# Patient Record
Sex: Male | Born: 1995 | Race: White | Marital: Single
Health system: Northeastern US, Academic
[De-identification: ages and names within clinical notes are randomized; demographics above are authoritative.]

## PROBLEM LIST (undated history)

## (undated) ENCOUNTER — Emergency Department (HOSPITAL_COMMUNITY): Discharge status: Against Medical Advice | Payer: Self-pay

## (undated) DIAGNOSIS — F988 Other specified behavioral and emotional disorders with onset usually occurring in childhood and adolescence: Secondary | ICD-10-CM

---
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
## 2016-05-11 ENCOUNTER — Ambulatory Visit: Admitting: Family Medicine

## 2016-05-11 LAB — HX INFLUENZA A&B BY PCR
CASE NUMBER: 2018087002934
HX INFLUENZA A BY PCR: NOT DETECTED
HX INFLUENZA B BY PCR: NOT DETECTED

## 2016-05-11 NOTE — Addendum Note (Signed)
REPORT:          SITE READ:[id number]    Findings were discussed with the patient's clinician, Josefine Class, by  Darrow Bussing MD on 05/11/2016 3:34 PM.    Darrow Bussing MD 05/11/2016 3:34 PM

## 2016-05-13 ENCOUNTER — Ambulatory Visit: Admitting: Family Medicine

## 2016-05-13 LAB — HX CBC W/ DIFF
CASE NUMBER: 2018089001252
HX HCT: 39.3 % — NL (ref 39.0–50.0)
HX HGB: 12.5 g/dL — ABNORMAL LOW (ref 13.0–17.0)
HX MCH: 28 pg — NL (ref 27.0–31.0)
HX MCHC: 31.8 g/dL — ABNORMAL LOW (ref 32.0–36.0)
HX MCV: 88 fL — NL (ref 80.0–94.0)
HX MPV: 10.1 fL — NL (ref 7.4–11.5)
HX NRBC PERCENT: 0 % — NL
HX PLATELET: 307 10*3/uL — NL (ref 150.0–400.0)
HX RBC: 4.47 10*6/uL — NL (ref 4.2–5.4)
HX RDW: 12.8 % — NL (ref 11.5–14.5)
HX WBC: 7.5 10*3/uL — NL (ref 3.6–10.5)

## 2016-05-13 LAB — HX THROAT CULTURE
CASE NUMBER: 2018087002935
HX F: NORMAL
HX P: NORMAL

## 2016-05-13 LAB — HX GLUCOSE FASTING
CASE NUMBER: 2018089001252
HX GLUCOSE FASTING: 101 mg/dL — NL (ref 65.0–110.0)

## 2016-05-13 LAB — HX URINALYSIS (COMPLETE)
CASE NUMBER: 2018089001253
HX UA BILIRUBIN: NEGATIVE — NL
HX UA BLOOD: NEGATIVE — NL
HX UA GLUCOSE: NEGATIVE — NL
HX UA HYALINE CASTS: 2 /LPF — NL (ref 0.0–4.0)
HX UA KETONES: NEGATIVE — NL
HX UA LEUKOCYTE ESTERASE: NEGATIVE — NL
HX UA NITRITE: NEGATIVE — NL
HX UA PH: 6 — NL (ref 5.0–8.0)
HX UA PROTEIN: NEGATIVE — NL
HX UA RBC: 1 /HPF — NL (ref 0.0–3.0)
HX UA SPECIFIC GRAVITY: 1.019 — NL (ref 1.005–1.03)
HX UA SQUAMOUS EPITHELIAL: <1 — NL (ref 0.0–5.0)
HX UA UROBILINOGEN: 4 — AB
HX UA WBC: 1 /HPF — NL (ref 0.0–5.0)

## 2016-05-13 LAB — HX HEPATIC FUNCTION PANEL
CASE NUMBER: 2018089001252
HX ALBUMIN LVL: 3.5 g/dL — NL (ref 3.5–5.0)
HX ALKALINE PHOSPHATASE: 72 U/L — NL (ref 45.0–117.0)
HX ALT: 52 U/L — NL (ref 6.0–78.0)
HX AST: 29 U/L — NL (ref 6.0–40.0)
HX BILIRUBIN DIRECT: 0.2 mg/dL — NL (ref 0.0–0.3)
HX BILIRUBIN TOTAL: 0.8 mg/dL — NL (ref 0.2–1.2)
HX TOTAL PROTEIN: 8.3 g/dL — ABNORMAL HIGH (ref 6.0–8.0)

## 2016-05-13 LAB — HX ELECTROLYTE PANEL
CASE NUMBER: 2018089001252
HX ANION GAP: 7 — NL (ref 3.0–11.0)
HX CHLORIDE: 107 mmol/L — NL (ref 98.0–110.0)
HX CO2: 26 mmol/L — NL (ref 21.0–32.0)
HX POTASSIUM LVL: 4.1 mmol/L — NL (ref 3.6–5.2)
HX SODIUM LVL: 140 mmol/L — NL (ref 136.0–145.0)

## 2016-05-13 LAB — HX BUN
CASE NUMBER: 2018089001252
HX BUN: 9 mg/dL — NL (ref 7.0–18.0)

## 2016-05-13 LAB — HX LIPID PANEL
CASE NUMBER: 2018089001252
HX CHOL: 120 mg/dL — ABNORMAL LOW (ref 140.0–200.0)
HX HDL: 27 mg/dL — ABNORMAL LOW (ref 41.0–60.0)
HX LDL: 70 mg/dL — NL (ref 0.0–129.0)
HX TRIG: 116 mg/dL — NL (ref 0.0–149.0)

## 2016-05-13 LAB — HX .AUTOMATED DIFF
CASE NUMBER: 2018089001252
HX ABSOLUTE NEUTRO COUNT: 4590 /mm3
HX BASOPHILS: 1 % — NL (ref 0.0–1.0)
HX EOSINOPHILS: 8 % — ABNORMAL HIGH (ref 0.0–3.0)
HX IMMATURE GRANULOCYTES: 0 % — NL (ref 0.0–2.0)
HX LYMPHOCYTES: 20 % — ABNORMAL LOW (ref 22.0–40.0)
HX MONOCYTES: 9 % — NL (ref 0.0–11.0)
HX NEU CT MEASURED: 4.59
HX NEUTROPHILS: 62 % — NL (ref 40.0–71.0)

## 2016-05-13 LAB — HX GLOMERULAR FILTRATION RATE (ESTIMATED)
CASE NUMBER: 2018089001252
HX AFN AMER GLOMERULAR FILTRATION RATE: >90
HX NON-AFN AMER GLOMERULAR FILTRATION RATE: >90

## 2016-05-13 LAB — HX TSH REFLEX PANEL (RECOMMENDED)
CASE NUMBER: 2018089001252
HX 3RD GEN TSH: 2.36 u[IU]/mL — NL (ref 0.463–3.98)

## 2016-05-13 LAB — HX CREATININE LEVEL
CASE NUMBER: 2018089001252
HX CREATININE: 0.637 mg/dL — NL (ref 0.55–1.3)

## 2016-05-16 LAB — HX THROAT CULTURE
CASE NUMBER: 2018089001254
HX F: NORMAL
HX P: NORMAL

## 2016-05-21 ENCOUNTER — Ambulatory Visit: Admitting: Family Medicine

## 2016-05-24 LAB — HX HEMOGLOBIN EVALUATION
CASE NUMBER: 2018097000998
HX HGB A1: 96.5 %
HX HGB A2: 2.8 %
HX HGB C: 0 %
HX HGB E: 0 %
HX HGB F: 0.7 %
HX HGB OTH: 0 %
HX HGB S: 0 %

## 2017-04-06 ENCOUNTER — Ambulatory Visit: Admitting: Specialist

## 2017-04-06 NOTE — Progress Notes (Addendum)
* * *        **[name]    --- ---    32 Y old Male, DOB: 1995/05/12    Account Number: [id number]    [address], [place], [id number]    Home: [phone]    Guarantor: Lexine Baton Insurance: [employer] - [employer]    External Visit ID: [id number]    Appointment Facility: [facility]        * * *    04/06/2017  Progress Notes: Reinaldo Berber, M.D.    --- ---    ---         **Current Medications**    ---       None    ---      Past Medical History    ---       Previous PCP: Dr. Terrilee Files [facility].        ---    Pre-Diabetic .        ---    Pneumonia 05/21/2016 - cxr.        ---       **Surgical History**    ---       Denies Past Surgical History    ---       **Family History**    ---       Father: alive    ---    Mother: alive    ---    Siblings: alive    ---    Brother: alive    ---    Children: alive    ---    Daughter(s): alive    ---    1 brother(s) - healthy. 1daughter(s) - healthy.    ---      **Social History**    ---    Diet: healthy.    Marijuana: yes.    Seat belts: yes.    no Sun Screen.    Guns in home: none.    Feels safe in home: yes.    no Drugs, cocaine use in the past .    Alcohol: yes .    Children: yes, daughter.    Education: yes, HS.    Exercise: yes.    Living with: friend .    Marital Status: single.    Occupation: employed, Development worker, community ( driver) [employer] .    Pets: dog.    Sexually active: yes.    Tobacco Use Status: current smoker smokes when stressed, 2 cigg per week ,  Patient screened/cessation counseled on: 04/06/2017, Patient counseled on  cessation: 04/06/2017.    no Travel outside [place].      **Allergies**    ---       N.K.D.A.    ---       **Hospitalization/Major Diagnostic Procedure**    ---       Denies Past Hospitalization    ---       **Review of Systems**    ---     _GENERAL ROS_ :    no chest pain. no shortness of breath. no wheezing. no nausea. no vomiting. no  diarrhea. no  constipation. no abdominal pain. no black or bloody stools. no  change in bowel or bladder function/habits. no dyspepsia. no dysphagia. no  dysuria. no hematuria. no nocturia. no fever. no chills. no rash. no night  sweats. no headaches. no dizziness. no lightheadedness. no change in moles. no  bone pain. no joint/muscle pain. no  weight loss/gain. appetite good. no edema.  no Back Pain. no Change in Vision. no Cold/heat intolerance. no Increase in  thirst/appetite. Anxiety/Stress yes. no Depression. Sleep Problems yes. no  Unexplain lumps. no Easy Brusing /Bleeding. no Any GU Symptoms. no fatigue. no  heartburn. no hot flashes and night sweats. no history of abnormal bleeding.  no hair loss. no palpitations. no Abnormal mood changes.            **Reason for Appointment**    ---       1\. New paitent physical, he presents to our office for new pt physical. He  has no cp or pressures, no nvd, no falls, h/o anxiety, smoking cessation is  adivsed.    ---       **History of Present Illness**    ---     _Depression Screening_ :    PHQ-2 (2015 Edition) Little interest or pleasure in doing things? Not at all,  Feeling down, depressed, or hopeless? Not at all, Total Score 0\.    _food choices_ :    c/o Calorie intake healthy diet is advised..    _Smoking Cessation_ :    Denies : Smoking cessation. Denies : Cough. Denies : Shortness of breath.      **Vital Signs**    ---    BP 130/70, HR 94, Ht 67.25, Wt 356, BMI 55.34, RR 18, Oxygen sat % 97.       **Examination**    ---     _General Examination_ :    GENERAL APPEARANCE: Well developed and well nourished, alert and oriented, no  acute distress, pleasant, cooperative . HEAD: Normocephalic, atraumatic, no  tenderness to palpation. HEENT: external ears normal, hearing grossly normal,  EAC's normal, tympanic membranes normal, no rhinorrhea, turbinates normal,  oropharynx clear with MMM. EYES EOMI, PERRL, anicteric, conjunctiva clear,  fundi benign, visual acuity grossly normal.  OROPHARYNX: moist mucous  membranes,no lesions, dentition in good repair. NECK: supple, no  lymphadenopathy, no thyromegaly, no carotid bruits, normal ROM, non-tender.  LYMPH NODES: normal, no axillary, cervical,supraclavicular or inguinal nodes.  HEART: regular rate and rhythm, normal S1 & S2, no murmurs, rubs, gallops or  clicks. LUNGS: clear to auscultation bilaterally, no rhonchi, rales or  wheezes. ABDOMEN: benign, soft, non-tender, no organomegaly, bowel sounds are  normal, no guarding, no hepatosplenomegaly, no masses palpated, no rebound, no  rigidity, no suprapubic tenderness, Abdominal bruit: no. BACK: unremarkable,  normal, normal range of motion of spine, no paraspinal tenderness, normal  alignment, no CVA tenderness, no tenderness. EXTREMITIES: no edema/calves non-  tender/no cord, pulses 2 plus bilaterally. NEUROLOGIC EXAM: grossly non-focal  exam, CN's II-XII grossly intact, DTR's 2+ & symmetric all 4 extremities, gait  normal, normal sensation and strength. SKIN: no rash, no atypical nevi.  MUSCULOSKELETAL: no weakness, no deformities. MALE GENITOURINARY: normal  external male genitalia,no penile lesions or discharge,testicles - nontender  and no palpable masses,no hernias, adult phallus and hair growth..  PSYCHOLOGICAL: affect normal. JOINTS: within normal limits - no gross  deformity. RECTAL: externally normal.          **Assessments**    ---    1\. Adult general medical exam - Z00.00 (Primary)    ---    2\. 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 Pre-diabetes - R73.03    ---    4\. Vitamin D deficiency - E55.9    ---    5\. Smoker - F17.200    ---  6\. Anxiety - F41.9    ---    7\. Mood disorder - F39    ---    8\. Sleep disorder - G47.9    ---       **Treatment**    ---       **1\. Adult general medical exam**    _LAB: CBC w/ Indices_   WBC  9.8    4.0-11.0 - thous/mm3    --- --- --- ---    RBC  4.60    4.20-5.90 - Mil/mm3    --- --- --- ---    Hgb  12.6  L   13.0-17.5 - Gm/dL    --- --- --- ---    Hct  40.5    39.0-53.0 - %    --- --- --- ---    MCV  88.0    80.0-100.0 - fL    --- --- --- ---    MCH  27.4    26.0-34.0 - pGm    --- --- --- ---    MCHC  31.1    31.0-37.0 - Gm/dL    --- --- --- ---    Platelet  302    150-400 - thous/mm3    --- --- --- ---    RDW-SD  41.8    35.0-51.0 - fL    --- --- --- ---    MPV  10.6    9.4-12.4 - fL    --- --- --- ---    _LAB: Comprehensive Metabolic Panel_ Creatinine  0.587    0.550-1.300 - mg/dL    --- --- --- ---    Sodium Lvl  140    136-146 - mmol/L    --- --- --- ---    Potassium Lvl  4.2    3.6-5.2 - mmol/L    --- --- --- ---    Chloride  107    98-110 - mmol/L    --- --- --- ---    CO2  25    21-32 - mmol/L    --- --- --- ---    Total Protein  7.5    6.0-8.4 - Gm/dL    --- --- --- ---    Albumin Lvl  3.8    3.2-5.0 - Gm/dL    --- --- --- ---    Calcium Lvl  8.8    8.5-10.5 - mg/dL    --- --- --- ---    Glucose Lvl  95    70-110 - mg/dL    --- --- --- ---    Bili Total  0.4    0.2-1.2 - mg/dL    --- --- --- ---    Alk Phos  80    30-117 - Units/L    --- --- --- ---    AST  19    6-40 - Units/L    --- --- --- ---    ALT  43    6-55 - Units/L    --- --- --- ---    Anion Gap  8    3-11 -    --- --- --- ---    BUN  11    6-20 - mg/dL    --- --- --- ---    _LAB: Hemoglobin A1c_ Mean Bld Glucos  108     \- mg/dL    --- --- --- ---    Glycated Hgb  5.4    <=5.6 - %    --- --- --- ---    _LAB: Lipid Panel_ Chol  122    <=200 - mg/dL    --- --- --- ---    HDL  36  L  >=40 - mg/dL    --- --- --- ---    Trig  91    <=150 - mg/dL    --- --- --- ---    LDL  68    <=130 - mg/dL    --- --- --- ---    _LAB: Microalbumin Level Urine_ Ur Microalb Random  10.0    <=20.0 - mg/L    --- --- --- ---    Ur Creat Microalb  171.0    15.0-392.0 - mg/dL    --- --- --- ---    Microalb/Creat  5.8    0.0-20.0 - mg/Gm_crea    --- --- --- ---    _LAB: TSH Reflex Panel (Recommended)_ 3rd Gen TSH  2.620    0.358-3.740 -  mclU/ml    --- --- --- ---    _LAB:  Urinalysis (Complete)_ UA Clarity  Clear    Clear -    --- --- --- ---    UA Spec Grav  1.019    1.003-1.030 -    --- --- --- ---    UA pH  5.0    5.0-8.0 -    --- --- --- ---    UA Protein  Negative    Negative - mg/dL    --- --- --- ---    UA Glucose  Negative    Negative - mg/dL    --- --- --- ---    UA Ketones  Negative    Negative - mg/dL    --- --- --- ---    UA Blood  Small  A  Negative -    --- --- --- ---    UA Bili  Negative    Negative -    --- --- --- ---    UA Urobilinogen  Negative    Negative -    --- --- --- ---    UA Nitrite  Negative    Negative -    --- --- --- ---    UA Leuk Est  Negative    Negative - WBC/uL    --- --- --- ---    UA RBC  1    0-2 - /HPF    --- --- --- ---    UA WBC  1    0-5 - /HPF    --- --- --- ---    UA Bacteria  None    None -    --- --- --- ---    UA Squamous Epi  <1    0-5 - /HPF    --- --- --- ---    UA Mucous  Few    None -    --- --- --- ---    UA Color  Yellow    Yellow -    --- --- --- ---    _LAB: Vitamin D 25 Hydroxy Level_ Vitamin D 25 OH Lvl  7  L  30-100 - nGm/ml    --- --- --- ---        Notes: Healthy diet and excercise is advised, and goals are advised.        **2\. Obesity, Class III, BMI 40-49.9 (morbid obesity)**    Notes: wt loss is advised.        **3\. Pre-diabetes**    _LAB: CBC w/ Indices_   WBC  9.8    4.0-11.0 - thous/mm3    --- --- --- ---  RBC  4.60    4.20-5.90 - Mil/mm3    --- --- --- ---    Hgb  12.6  L  13.0-17.5 - Gm/dL    --- --- --- ---    Hct  40.5    39.0-53.0 - %    --- --- --- ---    MCV  88.0    80.0-100.0 - fL    --- --- --- ---    MCH  27.4    26.0-34.0 - pGm    --- --- --- ---    MCHC  31.1    31.0-37.0 - Gm/dL    --- --- --- ---    Platelet  302    150-400 - thous/mm3    --- --- --- ---    RDW-SD  41.8    35.0-51.0 - fL    --- --- --- ---    MPV  10.6    9.4-12.4 - fL    --- --- --- ---    _LAB: Comprehensive Metabolic Panel_ Creatinine  0.587    0.550-1.300 - mg/dL    --- --- --- ---    Sodium Lvl  140    136-146 - mmol/L     --- --- --- ---    Potassium Lvl  4.2    3.6-5.2 - mmol/L    --- --- --- ---    Chloride  107    98-110 - mmol/L    --- --- --- ---    CO2  25    21-32 - mmol/L    --- --- --- ---    Total Protein  7.5    6.0-8.4 - Gm/dL    --- --- --- ---    Albumin Lvl  3.8    3.2-5.0 - Gm/dL    --- --- --- ---    Calcium Lvl  8.8    8.5-10.5 - mg/dL    --- --- --- ---    Glucose Lvl  95    70-110 - mg/dL    --- --- --- ---    Bili Total  0.4    0.2-1.2 - mg/dL    --- --- --- ---    Alk Phos  80    30-117 - Units/L    --- --- --- ---    AST  19    6-40 - Units/L    --- --- --- ---    ALT  43    6-55 - Units/L    --- --- --- ---    Anion Gap  8    3-11 -    --- --- --- ---    BUN  11    6-20 - mg/dL    --- --- --- ---    _LAB: Hemoglobin A1c_ Mean Bld Glucos  108     \- mg/dL    --- --- --- ---    Glycated Hgb  5.4    <=5.6 - %    --- --- --- ---    _LAB: Lipid Panel_ Chol  122    <=200 - mg/dL    --- --- --- ---    HDL  36  L  >=40 - mg/dL    --- --- --- ---    Trig  91    <=150 - mg/dL    --- --- --- ---    LDL  68    <=130 - mg/dL    --- --- --- ---    _LAB: Microalbumin Level Urine_ Ur Microalb Random  10.0    <=20.0 - mg/L    --- --- --- ---    Ur Creat Microalb  171.0    15.0-392.0 - mg/dL    --- --- --- ---    Microalb/Creat  5.8    0.0-20.0 - mg/Gm_crea    --- --- --- ---    _LAB: TSH Reflex Panel (Recommended)_ 3rd Gen TSH  2.620    0.358-3.740 -  mclU/ml    --- --- --- ---    _LAB: Urinalysis (Complete)_ UA Clarity  Clear    Clear -    --- --- --- ---    UA Spec Grav  1.019    1.003-1.030 -    --- --- --- ---    UA pH  5.0    5.0-8.0 -    --- --- --- ---    UA Protein  Negative    Negative - mg/dL    --- --- --- ---    UA Glucose  Negative    Negative - mg/dL    --- --- --- ---    UA Ketones  Negative    Negative - mg/dL    --- --- --- ---    UA Blood  Small  A  Negative -    --- --- --- ---    UA Bili  Negative    Negative -    --- --- --- ---    UA Urobilinogen  Negative    Negative -    --- --- --- ---    UA Nitrite   Negative    Negative -    --- --- --- ---    UA Leuk Est  Negative    Negative - WBC/uL    --- --- --- ---    UA RBC  1    0-2 - /HPF    --- --- --- ---    UA WBC  1    0-5 - /HPF    --- --- --- ---    UA Bacteria  None    None -    --- --- --- ---    UA Squamous Epi  <1    0-5 - /HPF    --- --- --- ---    UA Mucous  Few    None -    --- --- --- ---    UA Color  Yellow    Yellow -    --- --- --- ---    _LAB: Vitamin D 25 Hydroxy Level_ Vitamin D 25 OH Lvl  7  L  30-100 - nGm/ml    --- --- --- ---        Notes: Healthy diet and excercise and goals are advised.        **4\. Vitamin D deficiency**    Notes: vit d as needed.        **5\. Smoker**    Notes: cessation is advised.        **6\. Anxiety**    Notes: counseling is advised, no si or hi.        **7\. Mood disorder**    Notes: stable no si or hi.        **8\. Sleep disorder**    Notes:    sleep study as needed.    .        **9\. Others**    Notes: Patient Educated with: Physical activity.pdf (Physical activity.pdf).      **Preventive Medicine**    ---       [facility]  requested. he declines sleep study. dental care is advised,  Immnizations as needed.        ---       **Visit Codes**    ---       [id number] PREV MED VISIT NEW PT 18-39 YR.    ---      **Follow Up**    ---    prn, 3 Months (Reason: labs and testing)    Electronically signed by Reinaldo Berber , MD on 04/17/2017 at 09:02 PM EST    Sign off status: Completed        * * [facility]    [address]    [place], [place] [address]    Tel: [phone]    Fax: [phone]              * * *          Patient: NEVADA, MULLETT DOB: 02-19-1995 Progress Note: Reinaldo Berber,  M.D. 04/06/2017    ---    Note generated by eClinicalWorks EMR/PM Software ([email or web address])

## 2017-04-06 NOTE — Progress Notes (Addendum)
* * *        **  [name]    --- ---    78 Y old Male, DOB: Feb 22, 1995    [address] [place], [place] [address]    Home: [phone]    Provider: Reinaldo Berber        * * *    Telephone Encounter    ---    Answered by   Reinaldo Berber  Date: 04/06/2017         Time: 10:37 AM    Caller   Dr. Viviann Spare    --- ---            Reason   [facility] referral for counseling.            Action Taken                      Scuderi [place],Angela  04/06/2017 11:45:15 AM > referral to [facility] due to long wait list at [facility]. In your bin to be signed and faxed                    * * *                ---          * * *          Patient: Kyle Powers, Kyle Powers DOB: 17-Dec-1995 Provider: Reinaldo Berber  04/06/2017    ---    Note generated by eClinicalWorks EMR/PM Software ([email or web address])

## 2017-04-06 NOTE — Progress Notes (Addendum)
* * *        **  [name]    --- ---    47 Y old Male, DOB: [birth year]    [address], [place], [place] [address]    Home: [phone]    Provider: Reinaldo Berber        * * *    Telephone Encounter    ---    Answered by   Reinaldo Berber  Date: 04/06/2017         Time: 10:36 AM    Caller   Dr. Viviann Spare    --- ---            Reason   Nutrition/ dietician referral            Message                      for healthy diet and excercise and weight loss                Action Taken                      Scuderi [place],Angela  04/06/2017 11:26:20 AM > referral in your bin to be signed and faxed                    * * *                ---          * * *          Patient: Kyle Powers, Kyle Powers DOB: 05-Mar-1995 Provider: Reinaldo Berber  04/06/2017    ---    Note generated by eClinicalWorks EMR/PM Software ([email or web address])

## 2017-04-06 NOTE — Progress Notes (Addendum)
* * *        **  [name]    --- ---    76 Y old Male, DOB: 1995/12/30    [address] [place], [place] [address]    Home: [phone]    Provider: Reinaldo Berber        * * *    Telephone Encounter    ---    Answered by   Betsy Coder  Date: 04/06/2017         Time: 10:07 AM        * * *                ---          * * *          Patient: Kyle Powers, Kyle Powers DOB: [birth year] Provider: Reinaldo Berber  04/06/2017    ---    Note generated by eClinicalWorks EMR/PM Software ([email or web address])

## 2017-04-06 NOTE — Progress Notes (Addendum)
* * *        **  [name]    --- ---    41 Y old Male, DOB: 1996-02-09    [address], [place], [place] [address]    Home: [phone]    Provider: Reinaldo Berber        * * *    Telephone Encounter    ---    Answered by   Reinaldo Berber  Date: 04/06/2017         Time: 10:40 AM    Caller   Dr. Viviann Spare    --- ---            Reason   OMR pneumonia 2018            Message                      please obtain cxr report from [facility]                Action Taken                      Scuderi [place],Angela  04/06/2017 10:46:09 AM > report given to Dr. Viviann Spare at time of pts visit.                     * * *                ---          * * *          Patient: Kyle Powers, Kyle Powers DOB: 1996/01/14 Provider: Reinaldo Berber  04/06/2017    ---    Note generated by eClinicalWorks EMR/PM Software ([email or web address])

## 2017-04-11 ENCOUNTER — Ambulatory Visit: Admitting: Specialist

## 2017-04-11 LAB — HX MICROALBUMIN LEVEL URINE
CASE NUMBER: 2019057001251
HX MICROALBUMIN/CREAT RATIO: 5.8 mg_alb/Gm_crea — NL (ref 0.0–20.0)
HX UR CREAT (MICROALBUMIN): 171 mg/dL — NL (ref 15.0–392.0)
HX UR MICROALBUMIN RANDOM: 10 mg/L — NL

## 2017-04-11 LAB — HX COMPREHENSIVE METABOLIC PANEL
CASE NUMBER: 2019057001250
HX ALBUMIN LVL: 3.8 g/dL — NL (ref 3.2–5.0)
HX ALKALINE PHOSPHATASE: 80 U/L — NL (ref 30.0–117.0)
HX ALT: 43 U/L — NL (ref 6.0–55.0)
HX ANION GAP: 8 — NL (ref 3.0–11.0)
HX AST: 19 U/L — NL (ref 6.0–40.0)
HX BILIRUBIN TOTAL: 0.4 mg/dL — NL (ref 0.2–1.2)
HX BUN: 11 mg/dL — NL (ref 6.0–20.0)
HX CALCIUM LVL: 8.8 mg/dL — NL (ref 8.5–10.5)
HX CHLORIDE: 107 mmol/L — NL (ref 98.0–110.0)
HX CO2: 25 mmol/L — NL (ref 21.0–32.0)
HX CREATININE: 0.587 mg/dL — NL (ref 0.55–1.3)
HX GLUCOSE LVL: 95 mg/dL — NL (ref 70.0–110.0)
HX POTASSIUM LVL: 4.2 mmol/L — NL (ref 3.6–5.2)
HX SODIUM LVL: 140 mmol/L — NL (ref 136.0–146.0)
HX TOTAL PROTEIN: 7.5 g/dL — NL (ref 6.0–8.4)

## 2017-04-11 LAB — HX LIPID PANEL
CASE NUMBER: 2019057001250
HX CHOL: 122 mg/dL — NL
HX HDL: 36 mg/dL — ABNORMAL LOW
HX LDL: 68 mg/dL — NL
HX TRIG: 91 mg/dL — NL

## 2017-04-11 LAB — HX CBC W/ INDICES
CASE NUMBER: 2019057001250
HX ABSOLUTE NRBC COUNT: 0 10*3/uL
HX HCT: 40.5 % — NL (ref 39.0–53.0)
HX HGB: 12.6 g/dL — ABNORMAL LOW (ref 13.0–17.5)
HX MCH: 27.4 pg — NL (ref 26.0–34.0)
HX MCHC: 31.1 g/dL — NL (ref 31.0–37.0)
HX MCV: 88 fL — NL (ref 80.0–100.0)
HX MPV: 10.6 fL — NL (ref 9.4–12.4)
HX NRBC PERCENT: 0 % — NL
HX PLATELET: 302 10*3/uL — NL (ref 150.0–400.0)
HX RBC: 4.6 10*6/uL — NL (ref 4.2–5.9)
HX RDW-CV: 13.1 % — NL (ref 11.5–14.5)
HX RDW-SD: 41.8 fL — NL (ref 35.0–51.0)
HX WBC: 9.8 10*3/uL — NL (ref 4.0–11.0)

## 2017-04-11 LAB — HX URINALYSIS (COMPLETE)
CASE NUMBER: 2019057001251
HX UA BILIRUBIN: NEGATIVE — NL
HX UA GLUCOSE: NEGATIVE — NL
HX UA KETONES: NEGATIVE — NL
HX UA LEUKOCYTE ESTERASE: NEGATIVE — NL
HX UA NITRITE: NEGATIVE — NL
HX UA PH: 5 — NL (ref 5.0–8.0)
HX UA PROTEIN: NEGATIVE — NL
HX UA RBC: 1 /HPF — NL (ref 0.0–2.0)
HX UA SPECIFIC GRAVITY: 1.019 — NL (ref 1.003–1.03)
HX UA SQUAMOUS EPITHELIAL: <1 — NL (ref 0.0–5.0)
HX UA UROBILINOGEN: NEGATIVE — NL
HX UA WBC: 1 /HPF — NL (ref 0.0–5.0)

## 2017-04-11 LAB — HX GLOMERULAR FILTRATION RATE (ESTIMATED)
CASE NUMBER: 2019057001250
HX AFN AMER GLOMERULAR FILTRATION RATE: >90
HX NON-AFN AMER GLOMERULAR FILTRATION RATE: >90

## 2017-04-11 LAB — HX HEMOGLOBIN A1C
CASE NUMBER: 2019057001250
HX EST AVERAGE GLUCOSE (EAG): 108 mg/dL
HX HBF (INTERNAL): 1.1 % — NL
HX HEMOGLOBIN A1C: 5.4 % — NL
HX LA1C (INTERNAL): 1.7 % — NL
HX P3 PEAK (INTERNAL): 3.2 % — NL
HX P4 PEAK (INTERNAL): 1.2 % — NL
HX TOTAL AREA RANGE (INTERNAL): 1.63 microvolt/sec — NL (ref 1.0–3.5)

## 2017-04-11 LAB — HX TSH REFLEX PANEL (RECOMMENDED)
CASE NUMBER: 2019057001250
HX 3RD GEN TSH: 2.62 u[IU]/mL — NL (ref 0.358–3.74)

## 2017-04-11 LAB — HX VITAMIN D 25 HYDROXY LEVEL (RECOMMENDED)
CASE NUMBER: 2019057001250
HX VITAMIN D 25 OH LVL: 7 ng/mL — ABNORMAL LOW (ref 30.0–100.0)

## 2017-04-16 ENCOUNTER — Ambulatory Visit: Admitting: Specialist

## 2017-04-16 NOTE — Progress Notes (Addendum)
* * *        **  Kyle Powers    --- ---    13 Y old Male, DOB: Jun 12, 1995    [address], [place], [place] [address]    Home: [phone]    Provider: Reinaldo Berber        * * *    Telephone Encounter    ---    Answered by   Reinaldo Berber  Date: 04/16/2017         Time: 09:32 PM    Caller   Dr. Jacolyn Reedy    --- ---            Reason   Labs are noted - abnls            Message                      small blook on ua, vit d low, 7, hdl low, and hct slightly reduced.            keep healthy diet, and add vitamin d3 at 4,000 units daily, and healthy diet and excercise and wt loss is advised, check on the nutrition referral, keep follow up apt                Action Taken                      Scuderi [place],Angela  04/17/2017 9:51:05 AM > spoke with the pt he is aware.                     * * *                ---          * * *          Patient: Kyle Powers, Kyle Powers DOB: Feb 02, 1996 Provider: Reinaldo Berber  04/16/2017    ---    Note generated by eClinicalWorks EMR/PM Software ([email or web address])

## 2017-04-17 ENCOUNTER — Ambulatory Visit: Admitting: Specialist

## 2017-04-17 NOTE — Progress Notes (Addendum)
* * *        **  [name]    --- ---    13 Y old Male, DOB: [birth year]    [address], [place], [place] [address]    Home: [phone]    Provider: Reinaldo Berber        * * *    Telephone Encounter    ---    Answered by   Reinaldo Berber  Date: 04/17/2017         Time: 06:02 PM    Caller   Dr. Viviann Spare    --- ---            Reason   OMR received from Dr. Massie Maroon            Message                      noted                    * * *                ---          * * *          Patient: Kyle Powers, Kyle Powers DOB: Aug 09, 1995 Provider: Reinaldo Berber  04/17/2017    ---    Note generated by eClinicalWorks EMR/PM Software ([email or web address])

## 2017-07-06 ENCOUNTER — Ambulatory Visit: Admitting: Specialist

## 2017-07-06 ENCOUNTER — Ambulatory Visit (HOSPITAL_BASED_OUTPATIENT_CLINIC_OR_DEPARTMENT_OTHER)

## 2017-07-06 NOTE — Progress Notes (Addendum)
* * *        **  [name]    --- ---    30 Y old Male, DOB: 1995-07-31    [address], [place], [place] [address]    Home: [phone]    Provider: Reinaldo Berber        * * *    Telephone Encounter    ---    Answered by   Reinaldo Berber  Date: 07/06/2017         Time: 09:53 AM    Caller   Dr. Viviann Spare    --- ---            Reason   change to dbl book protocol today            Message                      pt missed his 9 am apt                Action Taken                      Hernandez,Stacey  07/06/2017 10:00:30 AM > it's already done...                    * * *                ---          * * *          Patient: Kyle Powers, Kyle Powers DOB: Mar 15, 1995 Provider: Reinaldo Berber  07/06/2017    ---    Note generated by eClinicalWorks EMR/PM Software ([email or web address])

## 2017-07-06 NOTE — Progress Notes (Addendum)
* * *        **Kyle Powers    --- ---    38 Y old Male, DOB: 09-Jan-1996    Account Number: [id number]    [address], [place], [id number]    Home: [phone]    Guarantor: Lexine Baton Insurance: [employer] - T[employer]    External Visit ID: [id number]    Appointment Facility: [facility]        * * *    07/06/2017  Progress Notes: Reinaldo Berber, M.D.    --- ---    ---         **Current Medications**    ---       None    ---      Past Medical History    ---       Previous PCP: Dr. Terrilee Files [facility].        ---    Pre-Diabetic .        ---    Pneumonia 05/21/2016 - cxr.        ---    Pre-diabetes.        ---    * Vit D 7 03/2017.        ---    * LDL 68 03/2017.        ---    * A1C 5.4 03/2017.        ---       **Surgical History**    ---       Denies Past Surgical History    ---       **Family History**    ---       Father: alive    ---    Mother: alive    ---    Siblings: alive    ---    Brother: alive    ---    Children: alive    ---    Daughter(s): alive    ---    1 brother(s) - healthy. 1daughter(s) - healthy.    ---      **Social History**    ---    Diet: healthy.    Marijuana: yes.    Seat belts: yes.    no Sun Screen.    Guns in home: none.    Feels safe in home: yes.    no Drugs, cocaine use in the past .    Alcohol: yes .    Children: yes, daughter.    Education: yes, HS.    Exercise: yes.    Living with: friend .    Marital Status: single.    Occupation: employed, Development worker, community ( driver) [employer] .    Pets: dog.    Sexually active: yes.    Tobacco Use Status: current smoker smokes when stressed, 2 cigg per week ,  Patient screened/cessation counseled on: 07/06/2017, Patient counseled on  cessation: 07/06/2017.    no Travel outside [place].      **Allergies**    ---       N.K.D.A.    ---       **Hospitalization/Major Diagnostic Procedure**    ---       Denies Past Hospitalization    ---       **Review of Systems**    ---      _GENERAL ROS_ :    no chest pain. no shortness of breath. no nausea. no diarrhea. no change in  bowel or bladder function/habits. no dysphagia. no nocturia. no night sweats.  no headaches. no change in moles. no weight loss/gain. no edema. no Cold/heat  intolerance. no Depression. no Unexplain lumps. no Any GU Symptoms. no  heartburn.            **Reason for Appointment**    ---       1\. Follow up 3 months on labs, and follow up, no cp or pressures, no loc,  eating and drinking ok no falls, has apt with wt loss clinic today, nutrition    ---    2\. Review blood work with pt, printed for the time of visit    ---    3\. Vitamin d is advised, no back pain, hematuria on urine    ---    4\. he feels that his mood and sleeping are better, he works with Dr. Massie Maroon    ---       **History of Present Illness**    ---     _Depression Screening_ :    PHQ-2 (2015 Edition) Little interest or pleasure in doing things? Not at all,  Feeling down, depressed, or hopeless? Not at all, Total Score 0\.    _food choices_ :    c/o Calorie intake.    healthy diet is advised.     _Smoking Cessation_ :    Denies : Smoking cessation. Denies : Cough. Denies : Shortness of breath.      **Vital Signs**    ---    Weight Change 20 lb, BP 120/78, HR 77, Ht 67.25, Wt 376, BMI 58.45, RR 18,  Oxygen sat % 98.       **Examination**    ---     _General Examination_ :    GENERAL APPEARANCE: alert and oriented, no acute distress, pleasant,  cooperative . HEENT: atraumatic,normocephalic, tm's are dull. EYES  eomi,perrla. OROPHARYNX: clear, no significant abnormality , normal, mucosa  moist, dentition, tongue, oral mucosa normal. NECK: supple. HEART: regular  rate and rhythm, no murmurs, rubs, gallops or clicks, normal S1S2. LUNGS:  clear to auscultation bilaterally, no rales, rhonci or wheezes.. ABDOMEN: Soft  and not tender, no guarding, no masses palpated, no rebound, no rigidity,  soft, bowel sounds present. BACK: unremarkable, normal, normal range  of motion  of spine, no CVA tenderness, no tenderness, lumbar spinous process tenderness.  EXTREMITIES: no edema/calves non-tender/no cord, pulses 2 plus bilaterally.  NEUROLOGIC EXAM: grossly non-focal exam, CN's II-XII grossly intact, DTR's 2+  & symmetric all 4 extremities, gait normal, normal sensation and strength.  SKIN: normal, no rash, no atypical nevi. PSYCHOLOGICAL: affect normal. JOINTS:  within normal limits - no gross deformity.          **Assessments**    ---    1\. Pre-diabetes - R73.03 (Primary)    ---    2\. Smoker - F17.200    ---    3\. Anxiety - F41.9    ---    4\. Mood disorder - F39    ---    5\. Sleep disorder - G47.9    ---    6\. 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 Vitamin D deficiency - E55.9    ---    8\. Hematuria - R31.9    ---       **Treatment**    ---       **1\. Pre-diabetes**    Notes: Healthy diet and excercise and  goals are advised.    ---        **2\. Smoker**    Notes:    cessation is advised. pt will consider    .        **3\. Anxiety**    Notes: no si or hi.        **4\. Mood disorder**    Notes: stable, no si or hi.        **5\. Sleep disorder**    Notes:    pt feels better, and is working on a wt loss plan.    .        **6\. Obesity, Class III, BMI 40-49.9 (morbid obesity)**    Notes: wt loss is advised.        **7\. Vitamin D deficiency**    Start Vitamin D3 Capsule, 5000 UNIT, 1 capsule, Orally, Once a day, 90 day(s),  90 Capsule, Refills 3    Notes: vit d as needed.        **8\. Hematuria**    _LAB: Urinalysis (Complete)_    Notes: testing is ordered.        **9\. Others**    Notes: Patient Educated with: Physical activity.pdf (Physical activity.pdf).      **Visit Codes**    ---       [address] OFFICE/OUTPATIENT VISIT.    ---      **Follow Up**    ---    prn, 3 Months (Reason: labs)    Electronically signed by Reinaldo Berber , MD on 07/06/2017 at 12:02 PM EDT    Sign off status: Completed        * * [facility]    [address]    [place], [place] [address]    Tel: [phone]    Fax: [phone]              * * *          Patient: Kyle Powers, Kyle Powers DOB: 11-05-1995 Progress Note: Reinaldo Berber,  M.D. 07/06/2017    ---    Note generated by eClinicalWorks EMR/PM Software ([email or web address])

## 2017-11-15 ENCOUNTER — Ambulatory Visit

## 2018-09-08 ENCOUNTER — Emergency Department (HOSPITAL_COMMUNITY)
Admission: EM | Admit: 2018-09-08 | Discharge: 2018-09-09 | Disposition: A | Discharge status: Home / Self Care | Payer: BC Managed Care – PPO | Attending: Emergency Medicine | Admitting: Emergency Medicine

## 2018-09-08 ENCOUNTER — Encounter (HOSPITAL_COMMUNITY): Payer: Self-pay | Admitting: Emergency Medicine

## 2018-09-08 ENCOUNTER — Other Ambulatory Visit: Payer: Self-pay

## 2018-09-08 DIAGNOSIS — R4589 Other symptoms and signs involving emotional state: Secondary | ICD-10-CM

## 2018-09-08 DIAGNOSIS — F4321 Adjustment disorder with depressed mood: Secondary | ICD-10-CM | POA: Insufficient documentation

## 2018-09-08 DIAGNOSIS — R45851 Suicidal ideations: Secondary | ICD-10-CM | POA: Diagnosis not present

## 2018-09-08 DIAGNOSIS — Z046 Encounter for general psychiatric examination, requested by authority: Secondary | ICD-10-CM | POA: Diagnosis present

## 2018-09-08 DIAGNOSIS — F909 Attention-deficit hyperactivity disorder, unspecified type: Secondary | ICD-10-CM | POA: Diagnosis not present

## 2018-09-08 NOTE — ED Triage Notes (Signed)
Pt here voluntary by [employer], pt reports he was talking to his girlfriend on the phone after an argument and made statements about wanting to hurt himself, a friend overheard the conversation and called PD, pt reports he has had thoughts of wanting to harm himself periodically since he was 41, pt reports he has cut himself with razors in the past but has been a couple of years, pt wanded by security at this time

## 2018-09-08 NOTE — ED Provider Notes (Signed)
[facility] EMERGENCY DEPARTMENT Provider Note   CSN: [id number] Arrival date & time: 09/08/18  2330    History   Chief Complaint Chief Complaint  Patient presents with  . V70.1    HPI Michael Armstrong is a 23 y.o. male.     Patient presents to the emergency department with police for voluntary psychiatric evaluation.  Patient was talking to his girlfriend on the phone earlier today and they were apparently arguing.  He made some comments about wanting to hurt himself.  A friend overheard this and called Event organiser.  Patient reports that he has a history of cutting.  He was stressed tonight and was starting to think about cutting, but was not intending to kill himself or kill anyone else.     Past Medical History:  Diagnosis Date  . ADD (attention deficit disorder) 2010    There are no active problems to display for this patient.   History reviewed. No pertinent surgical history.      Home Medications    Prior to Admission medications   Medication Sig Start Date End Date Taking? Authorizing Provider  benzonatate (TESSALON) 100 MG capsule Take 1 capsule (100 mg total) by mouth every 8 (eight) hours. 02/12/14   Orpah Greek, MD  guaiFENesin-codeine (CHERATUSSIN AC) 100-10 MG/5ML syrup Take 5 mLs by mouth 3 (three) times daily as needed for cough. 02/12/14   Orpah Greek, MD  ibuprofen (ADVIL,MOTRIN) 800 MG tablet Take 1 tablet (800 mg total) by mouth 3 (three) times daily. 02/12/14   Orpah Greek, MD  ondansetron (ZOFRAN) 4 MG tablet Take 1 tablet (4 mg total) by mouth every 6 (six) hours. 02/12/14   Orpah Greek, MD    Family History History reviewed. No pertinent family history.  Social History Social History   Tobacco Use  . Smoking status: Never Smoker  . Smokeless tobacco: Never Used  Substance Use Topics  . Alcohol use: Yes    Comment: occ  . Drug use: Yes    Types: Marijuana    Comment: daily      Allergies   Patient has no known allergies.   Review of Systems Review of Systems  Psychiatric/Behavioral: Negative for suicidal ideas.  All other systems reviewed and are negative.    Physical Exam Updated Vital Signs BP (!) 144/94 (BP Location: Right Wrist)   Pulse (!) 54   Temp 98.6 F (37 C) (Oral)   Resp 17   Wt (!) 152 kg   SpO2 99%   Physical Exam Vitals signs and nursing note reviewed.  Constitutional:      General: He is not in acute distress.    Appearance: Normal appearance. He is well-developed.  HENT:     Head: Normocephalic and atraumatic.     Right Ear: Hearing normal.     Left Ear: Hearing normal.     Nose: Nose normal.  Eyes:     Conjunctiva/sclera: Conjunctivae normal.     Pupils: Pupils are equal, round, and reactive to light.  Neck:     Musculoskeletal: Normal range of motion and neck supple.  Cardiovascular:     Rate and Rhythm: Regular rhythm.     Heart sounds: S1 normal and S2 normal. No murmur. No friction rub. No gallop.   Pulmonary:     Effort: Pulmonary effort is normal. No respiratory distress.     Breath sounds: Normal breath sounds.  Chest:     Chest wall: No tenderness.  Abdominal:     General: Bowel sounds are normal.     Palpations: Abdomen is soft.     Tenderness: There is no abdominal tenderness. There is no guarding or rebound. Negative signs include Murphy's sign and McBurney's sign.     Hernia: No hernia is present.  Musculoskeletal: Normal range of motion.  Skin:    General: Skin is warm and dry.     Findings: No rash.  Neurological:     Mental Status: He is alert and oriented to person, place, and time.     GCS: GCS eye subscore is 4. GCS verbal subscore is 5. GCS motor subscore is 6.     Cranial Nerves: No cranial nerve deficit.     Sensory: No sensory deficit.     Coordination: Coordination normal.  Psychiatric:        Speech: Speech normal.        Behavior: Behavior normal.        Thought Content: Thought  content normal.      ED Treatments / Results  Labs (all labs ordered are listed, but only abnormal results are displayed) Labs Reviewed  CBC WITH DIFFERENTIAL/PLATELET  COMPREHENSIVE METABOLIC PANEL  ACETAMINOPHEN LEVEL  SALICYLATE LEVEL  ETHANOL  RAPID URINE DRUG SCREEN, HOSP PERFORMED    EKG None  Radiology No results found.  Procedures Procedures (including critical care time)  Medications Ordered in ED Medications - No data to display   Initial Impression / Assessment and Plan / ED Course  I have reviewed the triage vital signs and the nursing notes.  Pertinent labs & imaging results that were available during my care of the patient were reviewed by me and considered in my medical decision making (see chart for details).        Patient calm and cooperative.  He made a comment earlier about wanting to harm himself.  He did not say he wanted to kill himself, he was thinking about cutting because this helps him with stress.  He did not act on it.  Here in the emergency department he denies homicidality and suicidality.  He will undergo psychiatric evaluation.  Final Clinical Impressions(s) / ED Diagnoses   Final diagnoses:  Dysphoric mood    ED Discharge Orders    None       , Canary Brimhristopher J, MD 09/08/18 2350

## 2018-09-09 LAB — CBC WITH DIFFERENTIAL/PLATELET
Abs Immature Granulocytes: 0.02 10*3/uL (ref 0.00–0.07)
Basophils Absolute: 0 10*3/uL (ref 0.0–0.1)
Basophils Relative: 0 %
Eosinophils Absolute: 0.2 10*3/uL (ref 0.0–0.5)
Eosinophils Relative: 2 %
HCT: 40.6 % (ref 39.0–52.0)
Hemoglobin: 12.8 g/dL — ABNORMAL LOW (ref 13.0–17.0)
Immature Granulocytes: 0 %
Lymphocytes Relative: 21 %
Lymphs Abs: 2.1 10*3/uL (ref 0.7–4.0)
MCH: 28.1 pg (ref 26.0–34.0)
MCHC: 31.5 g/dL (ref 30.0–36.0)
MCV: 89.2 fL (ref 80.0–100.0)
Monocytes Absolute: 0.9 10*3/uL (ref 0.1–1.0)
Monocytes Relative: 9 %
Neutro Abs: 6.8 10*3/uL (ref 1.7–7.7)
Neutrophils Relative %: 68 %
Platelets: 380 10*3/uL (ref 150–400)
RBC: 4.55 MIL/uL (ref 4.22–5.81)
RDW: 13.2 % (ref 11.5–15.5)
WBC: 10.1 10*3/uL (ref 4.0–10.5)
nRBC: 0 % (ref 0.0–0.2)

## 2018-09-09 LAB — COMPREHENSIVE METABOLIC PANEL
ALT: 43 U/L (ref 0–44)
AST: 27 U/L (ref 15–41)
Albumin: 4 g/dL (ref 3.5–5.0)
Alkaline Phosphatase: 58 U/L (ref 38–126)
Anion gap: 7 (ref 5–15)
BUN: 7 mg/dL (ref 6–20)
CO2: 24 mmol/L (ref 22–32)
Calcium: 8.8 mg/dL — ABNORMAL LOW (ref 8.9–10.3)
Chloride: 109 mmol/L (ref 98–111)
Creatinine, Ser: 0.57 mg/dL — ABNORMAL LOW (ref 0.61–1.24)
GFR calc Af Amer: >60 mL/min (ref >60)
GFR calc non Af Amer: >60 mL/min (ref >60)
Glucose, Bld: 108 mg/dL — ABNORMAL HIGH (ref 70–99)
Potassium: 3.7 mmol/L (ref 3.5–5.1)
Sodium: 140 mmol/L (ref 135–145)
Total Bilirubin: 0.9 mg/dL (ref 0.3–1.2)
Total Protein: 7.8 g/dL (ref 6.5–8.1)

## 2018-09-09 LAB — RAPID URINE DRUG SCREEN, HOSP PERFORMED
Amphetamines: NOT DETECTED
Barbiturates: NOT DETECTED
Benzodiazepines: NOT DETECTED
Cocaine: NOT DETECTED
Opiates: NOT DETECTED
Tetrahydrocannabinol: POSITIVE — AB

## 2018-09-09 LAB — ACETAMINOPHEN LEVEL: Acetaminophen (Tylenol), Serum: <10 ug/mL — ABNORMAL LOW (ref 10–30)

## 2018-09-09 LAB — SALICYLATE LEVEL: Salicylate Lvl: <7 mg/dL (ref 2.8–30.0)

## 2018-09-09 LAB — ETHANOL: Alcohol, Ethyl (B): <10 mg/dL (ref <10)

## 2018-09-09 MED ORDER — IBUPROFEN 400 MG PO TABS: 600.0000 mg | ORAL_TABLET | Freq: Three times a day (TID) | ORAL | Status: DC | PRN

## 2018-09-09 MED ORDER — ZOLPIDEM TARTRATE 5 MG PO TABS: 5.0000 mg | ORAL_TABLET | Freq: Every evening | ORAL | Status: DC | PRN

## 2018-09-09 MED ORDER — ALUM & MAG HYDROXIDE-SIMETH 200-200-20 MG/5ML PO SUSP: 30.0000 mL | Freq: Four times a day (QID) | ORAL | Status: DC | PRN

## 2018-09-09 NOTE — ED Notes (Signed)
Pt waiting for TTS consult to be completed.

## 2018-09-09 NOTE — ED Notes (Addendum)
Pt has been wanded by security in triage and room 14.

## 2018-09-09 NOTE — ED Provider Notes (Signed)
Patient was here voluntarily last evening after he became upset arguing with his girlfriend and said that he was gone to hurt himself.  He was evaluated by TTS and the plan was for reevaluation today but it is unclear when TTS is going to be able to do this.  The nurse checked in with him and found he was still far down the line for an evaluation.  The patient himself now wishes to be discharged.  He is not on an IVC.  When I spoke to him he said he was worried about getting fired and he needs to go so we can go back to work.  He denies any suicidal or homicidal ideations and he said he would return if he had any thoughts like that.  I offered that he could stay and I would write him a note for work but he said he would like to be discharged.  I do not see any indication that I need to hold him under an IVC.  Will discharge and give resources for outpatient follow-up.  He understands he can return if any worsening symptoms.   Hayden Rasmussen, MD 09/09/18 [phone]

## 2018-09-09 NOTE — ED Notes (Signed)
Pt speaking with TTS at this time.  

## 2018-09-09 NOTE — ED Notes (Signed)
Called [facility] about patient's TTS reassessment. Was told that they were very behind and had 36 patients to assess not including new stat assessments. Patient and Dr. Lacinda Axon informed of delay.

## 2018-09-09 NOTE — ED Notes (Signed)
Patient has become agitated. Patient was punching the bed and started punching the walls. He then threw is food tray and ripped the splash guard from counter and threw it across room.  Patient states, "I should have been out of here last night." Patient is tearful and says, "I was brought in voluntarily and I demand to leave."

## 2018-09-09 NOTE — ED Notes (Signed)
Pt says that he has been arguing with his girlfriend for the past week, tonight they have been arguing for several hours and he hold her he was going to hurt himself out of anger. Pt states that he wasn't really going to hurt himself. Pt admits that he has cut himself in the past but not to kill himself.
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
## 2019-01-30 ENCOUNTER — Ambulatory Visit: Payer: BC Managed Care – PPO | Attending: Internal Medicine

## 2019-01-30 ENCOUNTER — Other Ambulatory Visit: Payer: Self-pay

## 2019-01-30 DIAGNOSIS — Z20822 Contact with and (suspected) exposure to covid-19: Secondary | ICD-10-CM

## 2019-01-30 DIAGNOSIS — U071 COVID-19: Secondary | ICD-10-CM | POA: Insufficient documentation

## 2019-01-31 LAB — NOVEL CORONAVIRUS, NAA: SARS-CoV-2, NAA: DETECTED — AB

## 2019-01-31 NOTE — Progress Notes (Signed)
Order(s) created erroneously. Erroneous order ID: [id number]  Order moved by: Ellysa Parrack M  Order move date/time: 01/31/2019 1:36 PM  Source Patient: [id number]  Source Contact: 01/30/2019  Destination Patient: [id number]  Destination Contact: 01/30/2019 

## 2019-01-31 NOTE — Progress Notes (Signed)
Order(s) created erroneously. Erroneous order ID: [id number]  Order moved by: Brigitte Pulse  Order move date/time: 01/31/2019 1:36 PM  Source Patient: [id number]  Source Contact: 01/30/2019  Destination Patient: [id number]  Destination Contact: 01/30/2019

## 2019-01-31 NOTE — Progress Notes (Signed)
Orders moved to this encounter.  

## 2019-02-13 ENCOUNTER — Ambulatory Visit: Payer: BC Managed Care – PPO | Attending: Internal Medicine

## 2019-02-13 ENCOUNTER — Other Ambulatory Visit: Payer: BC Managed Care – PPO

## 2019-02-13 ENCOUNTER — Other Ambulatory Visit: Payer: Self-pay

## 2019-02-13 DIAGNOSIS — Z20822 Contact with and (suspected) exposure to covid-19: Secondary | ICD-10-CM

## 2019-02-14 LAB — NOVEL CORONAVIRUS, NAA: SARS-CoV-2, NAA: NOT DETECTED

## 2019-03-24 ENCOUNTER — Other Ambulatory Visit: Payer: Self-pay

## 2019-03-24 ENCOUNTER — Emergency Department: Payer: BC Managed Care – PPO

## 2019-03-24 ENCOUNTER — Encounter: Payer: Self-pay | Admitting: Emergency Medicine

## 2019-03-24 ENCOUNTER — Emergency Department
Admission: EM | Admit: 2019-03-24 | Discharge: 2019-03-24 | Disposition: A | Discharge status: Home / Self Care | Payer: BC Managed Care – PPO | Attending: Student in an Organized Health Care Education/Training Program | Admitting: Student in an Organized Health Care Education/Training Program

## 2019-03-24 DIAGNOSIS — S0990XA Unspecified injury of head, initial encounter: Secondary | ICD-10-CM | POA: Diagnosis present

## 2019-03-24 DIAGNOSIS — Z23 Encounter for immunization: Secondary | ICD-10-CM | POA: Diagnosis not present

## 2019-03-24 DIAGNOSIS — Y92410 Unspecified street and highway as the place of occurrence of the external cause: Secondary | ICD-10-CM | POA: Diagnosis not present

## 2019-03-24 DIAGNOSIS — S0181XA Laceration without foreign body of other part of head, initial encounter: Secondary | ICD-10-CM | POA: Insufficient documentation

## 2019-03-24 DIAGNOSIS — Y9389 Activity, other specified: Secondary | ICD-10-CM | POA: Diagnosis not present

## 2019-03-24 DIAGNOSIS — F1210 Cannabis abuse, uncomplicated: Secondary | ICD-10-CM | POA: Insufficient documentation

## 2019-03-24 DIAGNOSIS — M25512 Pain in left shoulder: Secondary | ICD-10-CM | POA: Insufficient documentation

## 2019-03-24 DIAGNOSIS — Y998 Other external cause status: Secondary | ICD-10-CM | POA: Insufficient documentation

## 2019-03-24 MED ORDER — LIDOCAINE-EPINEPHRINE-TETRACAINE (LET) TOPICAL GEL
3.0000 mL | Freq: Once | TOPICAL | Status: AC
Start: 2019-03-24 — End: 2019-03-24
  Administered 2019-03-24: 04:00:00 3 mL via TOPICAL
  Filled 2019-03-24: qty 3

## 2019-03-24 MED ORDER — ACETAMINOPHEN 500 MG PO TABS
1000.0000 mg | ORAL_TABLET | Freq: Once | ORAL | Status: AC
  Administered 2019-03-24: 04:00:00 1000 mg via ORAL
  Filled 2019-03-24: qty 2

## 2019-03-24 MED ORDER — TETANUS-DIPHTH-ACELL PERTUSSIS 5-2.5-18.5 LF-MCG/0.5 IM SUSP
0.5000 mL | Freq: Once | INTRAMUSCULAR | Status: AC
  Administered 2019-03-24: 0.5 mL via INTRAMUSCULAR
  Filled 2019-03-24: qty 0.5
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
## 2020-05-12 NOTE — Progress Notes (Addendum)
* * *        **  [name]    --- ---    13 Y old Male, DOB: [birth year]    [address], [place], [place] [address]    Home: [phone]    Provider: Reinaldo Berber        * * *    Telephone Encounter    ---    Answered by   Reinaldo Berber  Date: 04/17/2017         Time: 06:02 PM    Caller   Dr. Viviann Spare    --- ---            Reason   OMR received from Dr. Massie Maroon            Message                      noted                    * * *                ---          * * *          Patient: Kyle Powers, Kyle Powers DOB: Aug 09, 1995 Provider: Reinaldo Berber  04/17/2017    ---    Note generated by eClinicalWorks EMR/PM Software ([email or web address])

## 2020-05-12 NOTE — Progress Notes (Addendum)
* * *        **  Kyle Powers    --- ---    47 Y old Male, DOB: [birth year]    [address], [place], [place] [address]    Home: [phone]    Provider: Reinaldo Berber        * * *    Telephone Encounter    ---    Answered by   Reinaldo Berber  Date: 04/06/2017         Time: 10:36 AM    Caller   Dr. Viviann Spare    --- ---            Reason   Nutrition/ dietician referral            Message                      for healthy diet and excercise and weight loss                Action Taken                      Scuderi [place],Angela  04/06/2017 11:26:20 AM > referral in your bin to be signed and faxed                    * * *                ---          * * *          Patient: Kyle Powers, Kyle Powers DOB: 05-Mar-1995 Provider: Reinaldo Berber  04/06/2017    ---    Note generated by eClinicalWorks EMR/PM Software ([email or web address])

## 2020-05-12 NOTE — Progress Notes (Addendum)
* * *        **  [name]    --- ---    78 Y old Male, DOB: Feb 22, 1995    [address] [place], [place] [address]    Home: [phone]    Provider: Reinaldo Berber        * * *    Telephone Encounter    ---    Answered by   Reinaldo Berber  Date: 04/06/2017         Time: 10:37 AM    Caller   Dr. Viviann Spare    --- ---            Reason   [facility] referral for counseling.            Action Taken                      Scuderi [place],Angela  04/06/2017 11:45:15 AM > referral to [facility] due to long wait list at [facility]. In your bin to be signed and faxed                    * * *                ---          * * *          Patient: Kyle Powers, Kyle Powers DOB: 17-Dec-1995 Provider: Reinaldo Berber  04/06/2017    ---    Note generated by eClinicalWorks EMR/PM Software ([email or web address])

## 2020-05-12 NOTE — Progress Notes (Addendum)
* * *        **[name]    --- ---    32 Y old Male, DOB: 1995/05/12    Account Number: [id number]    [address], [place], [id number]    Home: [phone]    Guarantor: Lexine Baton Insurance: [employer] - [employer]    External Visit ID: [id number]    Appointment Facility: [facility]        * * *    04/06/2017  Progress Notes: Reinaldo Berber, M.D.    --- ---    ---         **Current Medications**    ---       None    ---      Past Medical History    ---       Previous PCP: Dr. Terrilee Files [facility].        ---    Pre-Diabetic .        ---    Pneumonia 05/21/2016 - cxr.        ---       **Surgical History**    ---       Denies Past Surgical History    ---       **Family History**    ---       Father: alive    ---    Mother: alive    ---    Siblings: alive    ---    Brother: alive    ---    Children: alive    ---    Daughter(s): alive    ---    1 brother(s) - healthy. 1daughter(s) - healthy.    ---      **Social History**    ---    Diet: healthy.    Marijuana: yes.    Seat belts: yes.    no Sun Screen.    Guns in home: none.    Feels safe in home: yes.    no Drugs, cocaine use in the past .    Alcohol: yes .    Children: yes, daughter.    Education: yes, HS.    Exercise: yes.    Living with: friend .    Marital Status: single.    Occupation: employed, Development worker, community ( driver) [employer] .    Pets: dog.    Sexually active: yes.    Tobacco Use Status: current smoker smokes when stressed, 2 cigg per week ,  Patient screened/cessation counseled on: 04/06/2017, Patient counseled on  cessation: 04/06/2017.    no Travel outside [place].      **Allergies**    ---       N.K.D.A.    ---       **Hospitalization/Major Diagnostic Procedure**    ---       Denies Past Hospitalization    ---       **Review of Systems**    ---     _GENERAL ROS_ :    no chest pain. no shortness of breath. no wheezing. no nausea. no vomiting. no  diarrhea. no  constipation. no abdominal pain. no black or bloody stools. no  change in bowel or bladder function/habits. no dyspepsia. no dysphagia. no  dysuria. no hematuria. no nocturia. no fever. no chills. no rash. no night  sweats. no headaches. no dizziness. no lightheadedness. no change in moles. no  bone pain. no joint/muscle pain. no  weight loss/gain. appetite good. no edema.  no Back Pain. no Change in Vision. no Cold/heat intolerance. no Increase in  thirst/appetite. Anxiety/Stress yes. no Depression. Sleep Problems yes. no  Unexplain lumps. no Easy Brusing /Bleeding. no Any GU Symptoms. no fatigue. no  heartburn. no hot flashes and night sweats. no history of abnormal bleeding.  no hair loss. no palpitations. no Abnormal mood changes.            **Reason for Appointment**    ---       1\. New paitent physical, he presents to our office for new pt physical. He  has no cp or pressures, no nvd, no falls, h/o anxiety, smoking cessation is  adivsed.    ---       **History of Present Illness**    ---     _Depression Screening_ :    PHQ-2 (2015 Edition) Little interest or pleasure in doing things? Not at all,  Feeling down, depressed, or hopeless? Not at all, Total Score 0\.    _food choices_ :    c/o Calorie intake healthy diet is advised..    _Smoking Cessation_ :    Denies : Smoking cessation. Denies : Cough. Denies : Shortness of breath.      **Vital Signs**    ---    BP 130/70, HR 94, Ht 67.25, Wt 356, BMI 55.34, RR 18, Oxygen sat % 97.       **Examination**    ---     _General Examination_ :    GENERAL APPEARANCE: Well developed and well nourished, alert and oriented, no  acute distress, pleasant, cooperative . HEAD: Normocephalic, atraumatic, no  tenderness to palpation. HEENT: external ears normal, hearing grossly normal,  EAC's normal, tympanic membranes normal, no rhinorrhea, turbinates normal,  oropharynx clear with MMM. EYES EOMI, PERRL, anicteric, conjunctiva clear,  fundi benign, visual acuity grossly normal.  OROPHARYNX: moist mucous  membranes,no lesions, dentition in good repair. NECK: supple, no  lymphadenopathy, no thyromegaly, no carotid bruits, normal ROM, non-tender.  LYMPH NODES: normal, no axillary, cervical,supraclavicular or inguinal nodes.  HEART: regular rate and rhythm, normal S1 & S2, no murmurs, rubs, gallops or  clicks. LUNGS: clear to auscultation bilaterally, no rhonchi, rales or  wheezes. ABDOMEN: benign, soft, non-tender, no organomegaly, bowel sounds are  normal, no guarding, no hepatosplenomegaly, no masses palpated, no rebound, no  rigidity, no suprapubic tenderness, Abdominal bruit: no. BACK: unremarkable,  normal, normal range of motion of spine, no paraspinal tenderness, normal  alignment, no CVA tenderness, no tenderness. EXTREMITIES: no edema/calves non-  tender/no cord, pulses 2 plus bilaterally. NEUROLOGIC EXAM: grossly non-focal  exam, CN's II-XII grossly intact, DTR's 2+ & symmetric all 4 extremities, gait  normal, normal sensation and strength. SKIN: no rash, no atypical nevi.  MUSCULOSKELETAL: no weakness, no deformities. MALE GENITOURINARY: normal  external male genitalia,no penile lesions or discharge,testicles - nontender  and no palpable masses,no hernias, adult phallus and hair growth..  PSYCHOLOGICAL: affect normal. JOINTS: within normal limits - no gross  deformity. RECTAL: externally normal.          **Assessments**    ---    1\. Adult general medical exam - Z00.00 (Primary)    ---    2\. 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 Pre-diabetes - R73.03    ---    4\. Vitamin D deficiency - E55.9    ---    5\. Smoker - F17.200    ---  6\. Anxiety - F41.9    ---    7\. Mood disorder - F39    ---    8\. Sleep disorder - G47.9    ---       **Treatment**    ---       **1\. Adult general medical exam**    _LAB: CBC w/ Indices_   WBC  9.8    4.0-11.0 - thous/mm3    --- --- --- ---    RBC  4.60    4.20-5.90 - Mil/mm3    --- --- --- ---    Hgb  12.6  L   13.0-17.5 - Gm/dL    --- --- --- ---    Hct  40.5    39.0-53.0 - %    --- --- --- ---    MCV  88.0    80.0-100.0 - fL    --- --- --- ---    MCH  27.4    26.0-34.0 - pGm    --- --- --- ---    MCHC  31.1    31.0-37.0 - Gm/dL    --- --- --- ---    Platelet  302    150-400 - thous/mm3    --- --- --- ---    RDW-SD  41.8    35.0-51.0 - fL    --- --- --- ---    MPV  10.6    9.4-12.4 - fL    --- --- --- ---    _LAB: Comprehensive Metabolic Panel_ Creatinine  0.587    0.550-1.300 - mg/dL    --- --- --- ---    Sodium Lvl  140    136-146 - mmol/L    --- --- --- ---    Potassium Lvl  4.2    3.6-5.2 - mmol/L    --- --- --- ---    Chloride  107    98-110 - mmol/L    --- --- --- ---    CO2  25    21-32 - mmol/L    --- --- --- ---    Total Protein  7.5    6.0-8.4 - Gm/dL    --- --- --- ---    Albumin Lvl  3.8    3.2-5.0 - Gm/dL    --- --- --- ---    Calcium Lvl  8.8    8.5-10.5 - mg/dL    --- --- --- ---    Glucose Lvl  95    70-110 - mg/dL    --- --- --- ---    Bili Total  0.4    0.2-1.2 - mg/dL    --- --- --- ---    Alk Phos  80    30-117 - Units/L    --- --- --- ---    AST  19    6-40 - Units/L    --- --- --- ---    ALT  43    6-55 - Units/L    --- --- --- ---    Anion Gap  8    3-11 -    --- --- --- ---    BUN  11    6-20 - mg/dL    --- --- --- ---    _LAB: Hemoglobin A1c_ Mean Bld Glucos  108     \- mg/dL    --- --- --- ---    Glycated Hgb  5.4    <=5.6 - %    --- --- --- ---    _LAB: Lipid Panel_ Chol  122    <=200 - mg/dL    --- --- --- ---    HDL  36  L  >=40 - mg/dL    --- --- --- ---    Trig  91    <=150 - mg/dL    --- --- --- ---    LDL  68    <=130 - mg/dL    --- --- --- ---    _LAB: Microalbumin Level Urine_ Ur Microalb Random  10.0    <=20.0 - mg/L    --- --- --- ---    Ur Creat Microalb  171.0    15.0-392.0 - mg/dL    --- --- --- ---    Microalb/Creat  5.8    0.0-20.0 - mg/Gm_crea    --- --- --- ---    _LAB: TSH Reflex Panel (Recommended)_ 3rd Gen TSH  2.620    0.358-3.740 -  mclU/ml    --- --- --- ---    _LAB:  Urinalysis (Complete)_ UA Clarity  Clear    Clear -    --- --- --- ---    UA Spec Grav  1.019    1.003-1.030 -    --- --- --- ---    UA pH  5.0    5.0-8.0 -    --- --- --- ---    UA Protein  Negative    Negative - mg/dL    --- --- --- ---    UA Glucose  Negative    Negative - mg/dL    --- --- --- ---    UA Ketones  Negative    Negative - mg/dL    --- --- --- ---    UA Blood  Small  A  Negative -    --- --- --- ---    UA Bili  Negative    Negative -    --- --- --- ---    UA Urobilinogen  Negative    Negative -    --- --- --- ---    UA Nitrite  Negative    Negative -    --- --- --- ---    UA Leuk Est  Negative    Negative - WBC/uL    --- --- --- ---    UA RBC  1    0-2 - /HPF    --- --- --- ---    UA WBC  1    0-5 - /HPF    --- --- --- ---    UA Bacteria  None    None -    --- --- --- ---    UA Squamous Epi  <1    0-5 - /HPF    --- --- --- ---    UA Mucous  Few    None -    --- --- --- ---    UA Color  Yellow    Yellow -    --- --- --- ---    _LAB: Vitamin D 25 Hydroxy Level_ Vitamin D 25 OH Lvl  7  L  30-100 - nGm/ml    --- --- --- ---        Notes: Healthy diet and excercise is advised, and goals are advised.        **2\. Obesity, Class III, BMI 40-49.9 (morbid obesity)**    Notes: wt loss is advised.        **3\. Pre-diabetes**    _LAB: CBC w/ Indices_   WBC  9.8    4.0-11.0 - thous/mm3    --- --- --- ---  RBC  4.60    4.20-5.90 - Mil/mm3    --- --- --- ---    Hgb  12.6  L  13.0-17.5 - Gm/dL    --- --- --- ---    Hct  40.5    39.0-53.0 - %    --- --- --- ---    MCV  88.0    80.0-100.0 - fL    --- --- --- ---    MCH  27.4    26.0-34.0 - pGm    --- --- --- ---    MCHC  31.1    31.0-37.0 - Gm/dL    --- --- --- ---    Platelet  302    150-400 - thous/mm3    --- --- --- ---    RDW-SD  41.8    35.0-51.0 - fL    --- --- --- ---    MPV  10.6    9.4-12.4 - fL    --- --- --- ---    _LAB: Comprehensive Metabolic Panel_ Creatinine  0.587    0.550-1.300 - mg/dL    --- --- --- ---    Sodium Lvl  140    136-146 - mmol/L     --- --- --- ---    Potassium Lvl  4.2    3.6-5.2 - mmol/L    --- --- --- ---    Chloride  107    98-110 - mmol/L    --- --- --- ---    CO2  25    21-32 - mmol/L    --- --- --- ---    Total Protein  7.5    6.0-8.4 - Gm/dL    --- --- --- ---    Albumin Lvl  3.8    3.2-5.0 - Gm/dL    --- --- --- ---    Calcium Lvl  8.8    8.5-10.5 - mg/dL    --- --- --- ---    Glucose Lvl  95    70-110 - mg/dL    --- --- --- ---    Bili Total  0.4    0.2-1.2 - mg/dL    --- --- --- ---    Alk Phos  80    30-117 - Units/L    --- --- --- ---    AST  19    6-40 - Units/L    --- --- --- ---    ALT  43    6-55 - Units/L    --- --- --- ---    Anion Gap  8    3-11 -    --- --- --- ---    BUN  11    6-20 - mg/dL    --- --- --- ---    _LAB: Hemoglobin A1c_ Mean Bld Glucos  108     \- mg/dL    --- --- --- ---    Glycated Hgb  5.4    <=5.6 - %    --- --- --- ---    _LAB: Lipid Panel_ Chol  122    <=200 - mg/dL    --- --- --- ---    HDL  36  L  >=40 - mg/dL    --- --- --- ---    Trig  91    <=150 - mg/dL    --- --- --- ---    LDL  68    <=130 - mg/dL    --- --- --- ---    _LAB: Microalbumin Level Urine_ Ur Microalb Random  10.0    <=20.0 - mg/L    --- --- --- ---    Ur Creat Microalb  171.0    15.0-392.0 - mg/dL    --- --- --- ---    Microalb/Creat  5.8    0.0-20.0 - mg/Gm_crea    --- --- --- ---    _LAB: TSH Reflex Panel (Recommended)_ 3rd Gen TSH  2.620    0.358-3.740 -  mclU/ml    --- --- --- ---    _LAB: Urinalysis (Complete)_ UA Clarity  Clear    Clear -    --- --- --- ---    UA Spec Grav  1.019    1.003-1.030 -    --- --- --- ---    UA pH  5.0    5.0-8.0 -    --- --- --- ---    UA Protein  Negative    Negative - mg/dL    --- --- --- ---    UA Glucose  Negative    Negative - mg/dL    --- --- --- ---    UA Ketones  Negative    Negative - mg/dL    --- --- --- ---    UA Blood  Small  A  Negative -    --- --- --- ---    UA Bili  Negative    Negative -    --- --- --- ---    UA Urobilinogen  Negative    Negative -    --- --- --- ---    UA Nitrite   Negative    Negative -    --- --- --- ---    UA Leuk Est  Negative    Negative - WBC/uL    --- --- --- ---    UA RBC  1    0-2 - /HPF    --- --- --- ---    UA WBC  1    0-5 - /HPF    --- --- --- ---    UA Bacteria  None    None -    --- --- --- ---    UA Squamous Epi  <1    0-5 - /HPF    --- --- --- ---    UA Mucous  Few    None -    --- --- --- ---    UA Color  Yellow    Yellow -    --- --- --- ---    _LAB: Vitamin D 25 Hydroxy Level_ Vitamin D 25 OH Lvl  7  L  30-100 - nGm/ml    --- --- --- ---        Notes: Healthy diet and excercise and goals are advised.        **4\. Vitamin D deficiency**    Notes: vit d as needed.        **5\. Smoker**    Notes: cessation is advised.        **6\. Anxiety**    Notes: counseling is advised, no si or hi.        **7\. Mood disorder**    Notes: stable no si or hi.        **8\. Sleep disorder**    Notes:    sleep study as needed.    .        **9\. Others**    Notes: Patient Educated with: Physical activity.pdf (Physical activity.pdf).      **Preventive Medicine**    ---       [facility]  requested. he declines sleep study. dental care is advised,  Immnizations as needed.        ---       **Visit Codes**    ---       [id number] PREV MED VISIT NEW PT 18-39 YR.    ---      **Follow Up**    ---    prn, 3 Months (Reason: labs and testing)    Electronically signed by Reinaldo Berber , MD on 04/17/2017 at 09:02 PM EST    Sign off status: Completed        * * [facility]    [address]    [place], [place] [address]    Tel: [phone]    Fax: [phone]              * * *          Patient: NEVADA, MULLETT DOB: 02-19-1995 Progress Note: Reinaldo Berber,  M.D. 04/06/2017    ---    Note generated by eClinicalWorks EMR/PM Software ([email or web address])

## 2020-05-12 NOTE — Progress Notes (Addendum)
* * *        **  [name]    --- ---    30 Y old Male, DOB: 1995-07-31    [address], [place], [place] [address]    Home: [phone]    Provider: Reinaldo Berber        * * *    Telephone Encounter    ---    Answered by   Reinaldo Berber  Date: 07/06/2017         Time: 09:53 AM    Caller   Dr. Viviann Spare    --- ---            Reason   change to dbl book protocol today            Message                      pt missed his 9 am apt                Action Taken                      Hernandez,Stacey  07/06/2017 10:00:30 AM > it's already done...                    * * *                ---          * * *          Patient: Kyle Powers, Kyle Powers DOB: Mar 15, 1995 Provider: Reinaldo Berber  07/06/2017    ---    Note generated by eClinicalWorks EMR/PM Software ([email or web address])

## 2020-05-12 NOTE — Progress Notes (Addendum)
* * *        **  [name]    --- ---    76 Y old Male, DOB: 1995/12/30    [address] [place], [place] [address]    Home: [phone]    Provider: Reinaldo Berber        * * *    Telephone Encounter    ---    Answered by   Betsy Coder  Date: 04/06/2017         Time: 10:07 AM        * * *                ---          * * *          Patient: Kyle Powers, Kyle Powers DOB: [birth year] Provider: Reinaldo Berber  04/06/2017    ---    Note generated by eClinicalWorks EMR/PM Software ([email or web address])

## 2020-05-12 NOTE — Progress Notes (Addendum)
* * *        **  [name]    --- ---    13 Y old Male, DOB: Jun 12, 1995    [address], [place], [place] [address]    Home: [phone]    Provider: Reinaldo Berber        * * *    Telephone Encounter    ---    Answered by   Reinaldo Berber  Date: 04/16/2017         Time: 09:32 PM    Caller   Dr. Jacolyn Reedy    --- ---            Reason   Labs are noted - abnls            Message                      small blook on ua, vit d low, 7, hdl low, and hct slightly reduced.            keep healthy diet, and add vitamin d3 at 4,000 units daily, and healthy diet and excercise and wt loss is advised, check on the nutrition referral, keep follow up apt                Action Taken                      Scuderi [place],Angela  04/17/2017 9:51:05 AM > spoke with the pt he is aware.                     * * *                ---          * * *          Patient: MARQUINN, MESCHKE DOB: Feb 02, 1996 Provider: Reinaldo Berber  04/16/2017    ---    Note generated by eClinicalWorks EMR/PM Software ([email or web address])

## 2020-05-12 NOTE — Progress Notes (Addendum)
* * *        **  [name]    --- ---    41 Y old Male, DOB: 1996-02-09    [address], [place], [place] [address]    Home: [phone]    Provider: Reinaldo Berber        * * *    Telephone Encounter    ---    Answered by   Reinaldo Berber  Date: 04/06/2017         Time: 10:40 AM    Caller   Dr. Viviann Spare    --- ---            Reason   OMR pneumonia 2018            Message                      please obtain cxr report from [facility]                Action Taken                      Scuderi [place],Angela  04/06/2017 10:46:09 AM > report given to Dr. Viviann Spare at time of pts visit.                     * * *                ---          * * *          Patient: Kyle Powers, Kyle Powers DOB: 1996/01/14 Provider: Reinaldo Berber  04/06/2017    ---    Note generated by eClinicalWorks EMR/PM Software ([email or web address])

## 2020-05-12 NOTE — Progress Notes (Addendum)
* * *        **Kyle Powers    --- ---    38 Y old Male, DOB: 09-Jan-1996    Account Number: [id number]    [address], [place], [id number]    Home: [phone]    Guarantor: Lexine Baton Insurance: [employer] - T[employer]    External Visit ID: [id number]    Appointment Facility: [facility]        * * *    07/06/2017  Progress Notes: Reinaldo Berber, M.D.    --- ---    ---         **Current Medications**    ---       None    ---      Past Medical History    ---       Previous PCP: Dr. Terrilee Files [facility].        ---    Pre-Diabetic .        ---    Pneumonia 05/21/2016 - cxr.        ---    Pre-diabetes.        ---    * Vit D 7 03/2017.        ---    * LDL 68 03/2017.        ---    * A1C 5.4 03/2017.        ---       **Surgical History**    ---       Denies Past Surgical History    ---       **Family History**    ---       Father: alive    ---    Mother: alive    ---    Siblings: alive    ---    Brother: alive    ---    Children: alive    ---    Daughter(s): alive    ---    1 brother(s) - healthy. 1daughter(s) - healthy.    ---      **Social History**    ---    Diet: healthy.    Marijuana: yes.    Seat belts: yes.    no Sun Screen.    Guns in home: none.    Feels safe in home: yes.    no Drugs, cocaine use in the past .    Alcohol: yes .    Children: yes, daughter.    Education: yes, HS.    Exercise: yes.    Living with: friend .    Marital Status: single.    Occupation: employed, Development worker, community ( driver) [employer] .    Pets: dog.    Sexually active: yes.    Tobacco Use Status: current smoker smokes when stressed, 2 cigg per week ,  Patient screened/cessation counseled on: 07/06/2017, Patient counseled on  cessation: 07/06/2017.    no Travel outside [place].      **Allergies**    ---       N.K.D.A.    ---       **Hospitalization/Major Diagnostic Procedure**    ---       Denies Past Hospitalization    ---       **Review of Systems**    ---      _GENERAL ROS_ :    no chest pain. no shortness of breath. no nausea. no diarrhea. no change in  bowel or bladder function/habits. no dysphagia. no nocturia. no night sweats.  no headaches. no change in moles. no weight loss/gain. no edema. no Cold/heat  intolerance. no Depression. no Unexplain lumps. no Any GU Symptoms. no  heartburn.            **Reason for Appointment**    ---       1\. Follow up 3 months on labs, and follow up, no cp or pressures, no loc,  eating and drinking ok no falls, has apt with wt loss clinic today, nutrition    ---    2\. Review blood work with pt, printed for the time of visit    ---    3\. Vitamin d is advised, no back pain, hematuria on urine    ---    4\. he feels that his mood and sleeping are better, he works with Dr. Massie Maroon    ---       **History of Present Illness**    ---     _Depression Screening_ :    PHQ-2 (2015 Edition) Little interest or pleasure in doing things? Not at all,  Feeling down, depressed, or hopeless? Not at all, Total Score 0\.    _food choices_ :    c/o Calorie intake.    healthy diet is advised.     _Smoking Cessation_ :    Denies : Smoking cessation. Denies : Cough. Denies : Shortness of breath.      **Vital Signs**    ---    Weight Change 20 lb, BP 120/78, HR 77, Ht 67.25, Wt 376, BMI 58.45, RR 18,  Oxygen sat % 98.       **Examination**    ---     _General Examination_ :    GENERAL APPEARANCE: alert and oriented, no acute distress, pleasant,  cooperative . HEENT: atraumatic,normocephalic, tm's are dull. EYES  eomi,perrla. OROPHARYNX: clear, no significant abnormality , normal, mucosa  moist, dentition, tongue, oral mucosa normal. NECK: supple. HEART: regular  rate and rhythm, no murmurs, rubs, gallops or clicks, normal S1S2. LUNGS:  clear to auscultation bilaterally, no rales, rhonci or wheezes.. ABDOMEN: Soft  and not tender, no guarding, no masses palpated, no rebound, no rigidity,  soft, bowel sounds present. BACK: unremarkable, normal, normal range  of motion  of spine, no CVA tenderness, no tenderness, lumbar spinous process tenderness.  EXTREMITIES: no edema/calves non-tender/no cord, pulses 2 plus bilaterally.  NEUROLOGIC EXAM: grossly non-focal exam, CN's II-XII grossly intact, DTR's 2+  & symmetric all 4 extremities, gait normal, normal sensation and strength.  SKIN: normal, no rash, no atypical nevi. PSYCHOLOGICAL: affect normal. JOINTS:  within normal limits - no gross deformity.          **Assessments**    ---    1\. Pre-diabetes - R73.03 (Primary)    ---    2\. Smoker - F17.200    ---    3\. Anxiety - F41.9    ---    4\. Mood disorder - F39    ---    5\. Sleep disorder - G47.9    ---    6\. 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 Vitamin D deficiency - E55.9    ---    8\. Hematuria - R31.9    ---       **Treatment**    ---       **1\. Pre-diabetes**    Notes: Healthy diet and excercise and  goals are advised.    ---        **2\. Smoker**    Notes:    cessation is advised. pt will consider    .        **3\. Anxiety**    Notes: no si or hi.        **4\. Mood disorder**    Notes: stable, no si or hi.        **5\. Sleep disorder**    Notes:    pt feels better, and is working on a wt loss plan.    .        **6\. Obesity, Class III, BMI 40-49.9 (morbid obesity)**    Notes: wt loss is advised.        **7\. Vitamin D deficiency**    Start Vitamin D3 Capsule, 5000 UNIT, 1 capsule, Orally, Once a day, 90 day(s),  90 Capsule, Refills 3    Notes: vit d as needed.        **8\. Hematuria**    _LAB: Urinalysis (Complete)_    Notes: testing is ordered.        **9\. Others**    Notes: Patient Educated with: Physical activity.pdf (Physical activity.pdf).      **Visit Codes**    ---       [address] OFFICE/OUTPATIENT VISIT.    ---      **Follow Up**    ---    prn, 3 Months (Reason: labs)    Electronically signed by Reinaldo Berber , MD on 07/06/2017 at 12:02 PM EDT    Sign off status: Completed        * * [facility]    [address]    [place], [place] [address]    Tel: [phone]    Fax: [phone]              * * *          Patient: Kyle Powers, Kyle Powers DOB: 11-05-1995 Progress Note: Reinaldo Berber,  M.D. 07/06/2017    ---    Note generated by eClinicalWorks EMR/PM Software ([email or web address])
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
## 2022-01-25 ENCOUNTER — Encounter (HOSPITAL_COMMUNITY): Payer: Self-pay

## 2022-01-25 ENCOUNTER — Emergency Department (HOSPITAL_COMMUNITY): Payer: Self-pay

## 2022-01-25 ENCOUNTER — Other Ambulatory Visit: Payer: Self-pay

## 2022-01-25 ENCOUNTER — Emergency Department (HOSPITAL_COMMUNITY)
Admission: EM | Admit: 2022-01-25 | Discharge: 2022-01-25 | Disposition: A | Discharge status: Home / Self Care | Payer: Self-pay | Attending: Emergency Medicine | Admitting: Emergency Medicine

## 2022-01-25 DIAGNOSIS — M25571 Pain in right ankle and joints of right foot: Secondary | ICD-10-CM | POA: Insufficient documentation

## 2022-01-25 MED ORDER — HYDROCODONE-ACETAMINOPHEN 5-325 MG PO TABS
1.0000 | ORAL_TABLET | Freq: Once | ORAL | Status: AC
  Administered 2022-01-25: 1 via ORAL
  Filled 2022-01-25: qty 1

## 2022-01-25 MED ORDER — IBUPROFEN 800 MG PO TABS
800.0000 mg | ORAL_TABLET | Freq: Once | ORAL | Status: AC
  Administered 2022-01-25: 800 mg via ORAL
  Filled 2022-01-25: qty 1

## 2022-01-25 MED ORDER — IBUPROFEN 800 MG PO TABS: 800.0000 mg | ORAL_TABLET | Freq: Three times a day (TID) | ORAL | 0 refills | Status: AC

## 2022-01-25 NOTE — Discharge Instructions (Signed)
Wear the braces as needed for support when standing or walking.  You may remove it at bedtime and for bathing.  You may wear your shoes over top of the brace if needed.  Follow-up with the orthopedic provider listed in 1 to 2 weeks if your symptoms are not improving.

## 2022-01-25 NOTE — ED Triage Notes (Signed)
Pt presents to ED with complaints of right ankle pain, Pt states has been going on since before Thanksgiving.

## 2022-01-25 NOTE — ED Provider Notes (Signed)
[facility] EMERGENCY DEPARTMENT Provider Note   CSN: [id number] Arrival date & time: 01/25/22  1313     History  Chief Complaint  Patient presents with   Ankle Pain    Michael Armstrong is a 26 y.o. male.   Ankle Pain Associated symptoms: no back pain, no fever and no neck pain        Michael Armstrong is a 26 y.o. male who presents to the Emergency Department complaining of persistent right ankle pain for nearly 1 month.  He reports pain to the ankle that began around Thanksgiving.  Ankle seem to be improving but pain reoccurred few days ago.  He describes constant pain that worsens with movement and weightbearing.  Pain improves slightly at rest.  He does admit to wearing poorly fitting shoes and believes he may have twisted his ankle while at work.  Denies fever, chills, pain to his foot or lower leg.  No rash or open wounds of the foot or ankle.    Home Medications Prior to Admission medications   Not on File      Allergies    Patient has no known allergies.    Review of Systems   Review of Systems  Constitutional:  Negative for appetite change, chills and fever.  Respiratory:  Negative for shortness of breath.   Cardiovascular:  Negative for chest pain.  Gastrointestinal:  Negative for nausea and vomiting.  Musculoskeletal:  Positive for arthralgias (Right ankle pain). Negative for back pain and neck pain.  Skin:  Negative for color change, rash and wound.  Neurological:  Negative for weakness, numbness and headaches.    Physical Exam Updated Vital Signs BP 125/77 (BP Location: Right Arm)   Pulse 75   Temp 99.1 F (37.3 C) (Oral)   Resp 20   Ht 5\' 9"  (1.753 m)   Wt (!) 167.4 kg   SpO2 99%   BMI 54.49 kg/m  Physical Exam Vitals and nursing note reviewed.  Constitutional:      General: He is not in acute distress.    Appearance: Normal appearance. He is not ill-appearing or toxic-appearing.  Cardiovascular:     Rate and Rhythm: Normal rate and  regular rhythm.     Pulses: Normal pulses.  Pulmonary:     Effort: Pulmonary effort is normal. No respiratory distress.     Breath sounds: No wheezing.  Musculoskeletal:        General: Swelling and tenderness present. No deformity.     Right lower leg: No edema.     Left lower leg: No edema.     Right ankle: Swelling present. No lacerations. Tenderness present over the lateral malleolus and medial malleolus. No proximal fibula tenderness. Decreased range of motion. Anterior drawer test negative. Normal pulse.     Right Achilles Tendon: Normal. No tenderness or defects.     Comments: Diffuse tenderness to palpation over the medial and lateral aspects of the right ankle.  Mild soft tissue swelling noted, no excessive warmth erythema or open wound of the lower leg, ankle or foot.  Compartments of the extremity are soft.  Skin:    General: Skin is warm.     Capillary Refill: Capillary refill takes less than 2 seconds.     Findings: No rash.  Neurological:     General: No focal deficit present.     Mental Status: He is alert.     Sensory: No sensory deficit.     Motor: No weakness.  ED Results / Procedures / Treatments   Labs (all labs ordered are listed, but only abnormal results are displayed) Labs Reviewed - No data to display  EKG None  Radiology DG Ankle Complete Right  Result Date: 01/25/2022 CLINICAL DATA:  Twisted right ankle around Thanksgiving. Pain and swelling. EXAM: RIGHT ANKLE - COMPLETE 3+ VIEW COMPARISON:  None Available. FINDINGS: Mild distal medial malleolar and adjacent talar degenerative spurs. Mild distal anterior tibial find degenerative osteophytosis. Mild dorsal navicular-cuneiform degenerative osteophytes. Mild chronic enthesopathic change at the Achilles insertion on the calcaneus. No acute fracture or dislocation. Moderate lateral and mild medial ankle soft tissue swelling. IMPRESSION: Moderate lateral and mild medial ankle soft tissue swelling. No acute  fracture. Electronically Signed   By: Neita Garnet M.D.   On: 01/25/2022 14:13    Procedures Procedures    Medications Ordered in ED Medications  ibuprofen (ADVIL) tablet 800 mg (has no administration in time range)  HYDROcodone-acetaminophen (NORCO/VICODIN) 5-325 MG per tablet 1 tablet (has no administration in time range)    ED Course/ Medical Decision Making/ A&P                           Medical Decision Making Patient is here for evaluation of right ankle pain for nearly 1 month.  Pain has been waxing waning in severity.  Endorses frequent standing and twisting associated with his job.  Believes he may have twisted his ankle but denies known injury or fall.  On exam, there is some mild soft tissue swelling of the ankle.  There is no excessive warmth erythema or edema.  There is no bony deformity the lower leg and foot are nontender no concerning symptoms for septic joint.  Achilles tendon appears intact and is without tenderness neurovascularly intact.  Differential includes musculoskeletal injury, septic joint, internal derangement and fracture.  No reported trauma to suggest dislocation or fracture, clinical exam more consistent with inflammatory process versus strain.  No findings suggestive of septic joint or derangement.  Amount and/or Complexity of Data Reviewed Radiology: ordered.    Details: X-ray of the right ankle shows moderate lateral and medial ankle soft tissue swelling with degenerative spurring of the medial malleoli and talus Discussion of management or test interpretation with external provider(s): Discussed findings with the patient, he is agreeable to symptomatic treatment,  ASO applied for comfort and support.  Agreeable to RICE therapy and orthopedic follow-up in 1 to 2 weeks if not improving.  Risk Prescription drug management.           Final Clinical Impression(s) / ED Diagnoses Final diagnoses:  Acute right ankle pain    Rx / DC Orders ED  Discharge Orders     None         Pauline Aus, PA-C 01/25/22 1554    Benjiman Core, MD 01/26/22 0020
